# Patient Record
Sex: Female | Born: 1990 | Race: White | Hispanic: No | Marital: Single | Smoking: Current every day smoker
Health system: Southern US, Community
[De-identification: ages and names within clinical notes are randomized; demographics above are authoritative.]

## PROBLEM LIST (undated history)

## (undated) DIAGNOSIS — E282 Polycystic ovarian syndrome: Secondary | ICD-10-CM

## (undated) HISTORY — PX: KNEE ARTHROSCOPY W/ ACL RECONSTRUCTION: SHX1858

## (undated) HISTORY — PX: APPENDECTOMY: SHX54

---

## 2020-12-02 ENCOUNTER — Encounter (HOSPITAL_COMMUNITY): Payer: Self-pay

## 2020-12-02 ENCOUNTER — Emergency Department (HOSPITAL_COMMUNITY)
Admission: EM | Admit: 2020-12-02 | Discharge: 2020-12-02 | Disposition: A | Discharge status: Home / Self Care | Payer: Medicaid Other | Attending: Emergency Medicine | Admitting: Emergency Medicine

## 2020-12-02 ENCOUNTER — Emergency Department (HOSPITAL_COMMUNITY): Payer: Medicaid Other

## 2020-12-02 DIAGNOSIS — O99331 Smoking (tobacco) complicating pregnancy, first trimester: Secondary | ICD-10-CM | POA: Diagnosis not present

## 2020-12-02 DIAGNOSIS — R102 Pelvic and perineal pain: Secondary | ICD-10-CM | POA: Diagnosis not present

## 2020-12-02 DIAGNOSIS — F17210 Nicotine dependence, cigarettes, uncomplicated: Secondary | ICD-10-CM | POA: Insufficient documentation

## 2020-12-02 DIAGNOSIS — O26891 Other specified pregnancy related conditions, first trimester: Secondary | ICD-10-CM | POA: Insufficient documentation

## 2020-12-02 DIAGNOSIS — Z3A01 Less than 8 weeks gestation of pregnancy: Secondary | ICD-10-CM | POA: Insufficient documentation

## 2020-12-02 HISTORY — DX: Polycystic ovarian syndrome: E28.2

## 2020-12-02 LAB — URINALYSIS, ROUTINE W REFLEX MICROSCOPIC
Bilirubin Urine: NEGATIVE
Glucose, UA: NEGATIVE mg/dL
Hgb urine dipstick: NEGATIVE
Ketones, ur: NEGATIVE mg/dL
Leukocytes,Ua: NEGATIVE
Nitrite: NEGATIVE
Protein, ur: NEGATIVE mg/dL
Specific Gravity, Urine: 1.014 (ref 1.005–1.030)
pH: 6 (ref 5.0–8.0)

## 2020-12-02 LAB — HCG, QUANTITATIVE, PREGNANCY: hCG, Beta Chain, Quant, S: 3061 m[IU]/mL — ABNORMAL HIGH (ref <5)

## 2020-12-02 MED ORDER — FENTANYL CITRATE (PF) 100 MCG/2ML IJ SOLN
50.0000 ug | Freq: Once | INTRAMUSCULAR | Status: AC
Start: 2020-12-02 — End: 2020-12-02
  Administered 2020-12-02: 50 ug via INTRAMUSCULAR
  Filled 2020-12-02: qty 2

## 2020-12-02 MED ORDER — FENTANYL CITRATE (PF) 100 MCG/2ML IJ SOLN: 50.0000 ug | Freq: Once | INTRAMUSCULAR | Status: DC

## 2020-12-02 NOTE — Discharge Instructions (Addendum)
Your workup today was overall reassuring. Please make sure to follow-up with your OB/GYN.  Return to the ER for any new or worsening symptoms.

## 2020-12-02 NOTE — ED Notes (Signed)
Pt discharged from this ED in stable condition at this time. All discharge instructions and follow up care reviewed with pt with no further questions at this time. Pt ambulatory with steady gait, clear speech.  

## 2020-12-02 NOTE — ED Provider Notes (Signed)
[facility]-EMERGENCY DEPT Provider Note   CSN: [id number] Arrival date & time: 12/02/20  1108     History Chief Complaint  Patient presents with  . Pelvic Pain    Patricia Rowland is a 30 y.o. female.  HPI 30 year old female with a history of PCOS presents to the ER with left lower pelvic pain.  Patient states that she is approximately [redacted] weeks pregnant, had a ultrasound approximately a week ago which did not confirm an intrauterine pregnancy.  She came here to the ER yesterday, had labs done, urine, hCG but left without being seen.  She states that she left because she started to feel better.  She then started to develop some sharper left lower pelvic pain this morning which brought her back to the ER.  She denies any nausea or vomiting.  No vaginal bleeding or discharge.  She states that she is scheduled for an abortion next week.  She does have an OB/GYN.    Past Medical History:  Diagnosis Date  . PCOS (polycystic ovarian syndrome)     There are no problems to display for this patient.   Past Surgical History:  Procedure Laterality Date  . APPENDECTOMY    . KNEE ARTHROSCOPY W/ ACL RECONSTRUCTION       OB History    Gravida  1   Para      Term      Preterm      AB      Living        SAB      IAB      Ectopic      Multiple      Live Births              History reviewed. No pertinent family history.  Social History   Tobacco Use  . Smoking status: Current Every Day Smoker    Packs/day: 0.50    Types: Cigarettes  . Smokeless tobacco: Never Used    Home Medications Prior to Admission medications   Medication Sig Start Date End Date Taking? Authorizing Provider  acetaminophen (TYLENOL) 500 MG tablet Take 1,000 mg by mouth every 6 (six) hours as needed for mild pain, headache or fever.   Yes [provider]  ibuprofen (ADVIL) 200 MG tablet Take 800 mg by mouth every 6 (six) hours as needed for fever, mild pain  or headache.   Yes [provider]  cephALEXin (KEFLEX) 500 MG capsule Take 500 mg by mouth 2 (two) times daily. 11/22/20   [provider]    Allergies    Patient has no known allergies.  Review of Systems   Review of Systems  Constitutional: Negative for chills and fever.  HENT: Negative for ear pain and sore throat.   Eyes: Negative for pain and visual disturbance.  Respiratory: Negative for cough and shortness of breath.   Cardiovascular: Negative for chest pain and palpitations.  Gastrointestinal: Negative for abdominal pain and vomiting.  Genitourinary: Positive for pelvic pain. Negative for dysuria and hematuria.  Musculoskeletal: Negative for arthralgias and back pain.  Skin: Negative for color change and rash.  Neurological: Negative for seizures and syncope.  All other systems reviewed and are negative.   Physical Exam Updated Vital Signs BP (!) 124/108   Pulse 76   Temp 98 F (36.7 C) (Oral)   Resp 16   SpO2 98%   Physical Exam Vitals and nursing note reviewed.  Constitutional:  General: She is not in acute distress.    Appearance: She is well-developed.  HENT:     Head: Normocephalic and atraumatic.  Eyes:     Conjunctiva/sclera: Conjunctivae normal.  Cardiovascular:     Rate and Rhythm: Normal rate and regular rhythm.     Heart sounds: No murmur heard.   Pulmonary:     Effort: Pulmonary effort is normal. No respiratory distress.     Breath sounds: Normal breath sounds.  Abdominal:     Palpations: Abdomen is soft.     Tenderness: There is no abdominal tenderness.  Genitourinary:    Cervix: Normal.     Uterus: Normal.      Adnexa:        Right: No tenderness.         Left: Tenderness present.   Musculoskeletal:     Cervical back: Neck supple.  Skin:    General: Skin is warm and dry.  Neurological:     Mental Status: She is alert.     ED Results / Procedures / Treatments   Labs (all labs ordered are listed, but only  abnormal results are displayed) Labs Reviewed  URINALYSIS, ROUTINE W REFLEX MICROSCOPIC  HCG, QUANTITATIVE, PREGNANCY    EKG None  Radiology US OB Comp < 14 Wks  Result Date: 12/02/2020 CLINICAL DATA:  Pelvic pain. Estimated gestational age per LMP 7 weeks 0 days. Quantitative beta HCG from 12/01/2020 is 3114. No quantitative HCG today. EXAM: OBSTETRIC <14 WK [place] AND TRANSVAGINAL OB US TECHNIQUE: Both transabdominal and transvaginal ultrasound examinations were performed for complete evaluation of the gestation as well as the maternal uterus, adnexal regions, and pelvic cul-de-sac. Transvaginal technique was performed to assess early pregnancy. COMPARISON:  11/21/2020 from [facility] medical center. FINDINGS: Intrauterine gestational sac: None visualized over the endometrium. Yolk sac:  Not visualized. Embryo: Not visualized. Cardiac Activity: Not visualized. Heart Rate: Not visualized bpm MSD: Not visualized. CRL: Not visualized. Subchorionic hemorrhage:  None visualized. Maternal uterus/adnexae: There is an oval hypoechoic focus over the anterior uterine fundus as seen on the previous ultrasound measuring approximately 9 mm in diameter (previously 1.3 cm). No definitive fetal pole or yolk sac within this small hypoechoic focus as this was thought to previously represent intramural fibroid. There is a central area of increased echogenicity within this small oval hypoechoic focus. It is much less likely that this represents an intrauterine gestational sac. Ovaries are normal size, shape and position without evidence of focal mass. Normal color Doppler is present over the left ovary with peripheral color Doppler over the right ovary. Multiple peripheral follicles within each ovary. No additional masses or abnormality within each adnexa. No significant free pelvic fluid. IMPRESSION: Normal endometrium. Indeterminate oval 9 mm hypoechoic area over the anterior uterine fundus slightly smaller than seen on the  recent previous ultrasound and most typical of a fibroid. No definitive gestational sac/IUP visualized. No abnormal adnexal findings. No intrauterine gestational sac, yolk sac, fetal pole, or cardiac activity visualized. Differential considerations include intrauterine gestation too early to be sonographically visualized, spontaneous abortion, or ectopic pregnancy. Consider follow-up ultrasound in 14 days and serial quantitative beta HCG follow-up. Electronically Signed   By: Elberta Fortis M.D.   On: 12/02/2020 13:57    Procedures Procedures   Medications Ordered in ED Medications  fentaNYL (SUBLIMAZE) injection 50 mcg (50 mcg Intramuscular Given 12/02/20 1250)    ED Course  I have reviewed the triage vital signs and the nursing notes.  Pertinent labs &  imaging results that were available during my care of the patient were reviewed by me and considered in my medical decision making (see chart for details).    MDM Rules/Calculators/A&P                          30 year old female is currently [redacted] weeks pregnant with left lower pelvic pain.  Vitals on arrival reassuring.  She had some left-sided adnexal tenderness on exam.  No visible blood products in the vaginal vault.  Reviewed labs from yesterday, CBC and BMP largely unremarkable.  UA without evidence of UTI.  Ultrasound without confirmation of intrauterine pregnancy, a recommend repeat ultrasound in 14 days.  Will repeat quant, her quant from yesterday was 3114.  On reevaluation, patient is significant improvement in her pain after pain medicine.  Patient states that she is following up with a clinic next week.  Low suspicion for diverticulitis, no evidence of ruptured ectopic at this time.  We discussed return precautions.  She voiced understanding and is agreeable.  Case discussed with Dr. Pauline Aus who is agreeable to the above plan and disposition. Final Clinical Impression(s) / ED Diagnoses Final diagnoses:  Pelvic pain in female    Rx /  DC Orders ED Discharge Orders    None       Leone Brand 12/02/20 1441    Tilden Fossa, MD 12/02/20 1527

## 2020-12-02 NOTE — ED Triage Notes (Signed)
Pt arrived via walk in, c/o pelvic pain and back pain. States she is approx [redacted] wks pregnant, last menes feb 25th. Denies any active bleeding.

## 2022-02-27 IMAGING — US US OB COMP LESS 14 WK
1 series · 13 of 28 positions shown · non-contrast
Comparison: 11/21/2020 from [facility] [HOSPITAL[facility]

CLINICAL DATA: Pelvic pain. Estimated gestational age per LMP 7
quantitative HCG today.

EXAM:
OBSTETRIC <14 WK US AND TRANSVAGINAL OB US
TECHNIQUE: Both transabdominal and transvaginal ultrasound examinations were
performed for complete evaluation of the gestation as well as the
maternal uterus, adnexal regions, and pelvic cul-de-sac.
Transvaginal technique was performed to assess early pregnancy.

[Series 1: us ob comp less 14 wk · 13 of 104 slices shown]
[im 4/104]
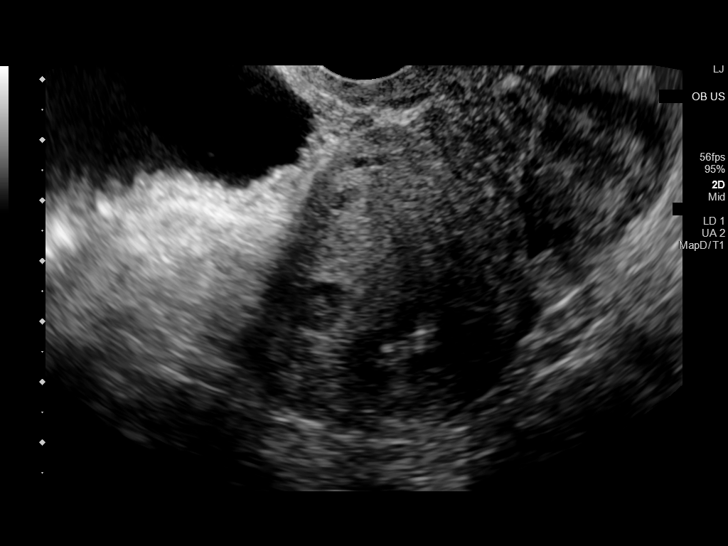
[im 12/104]
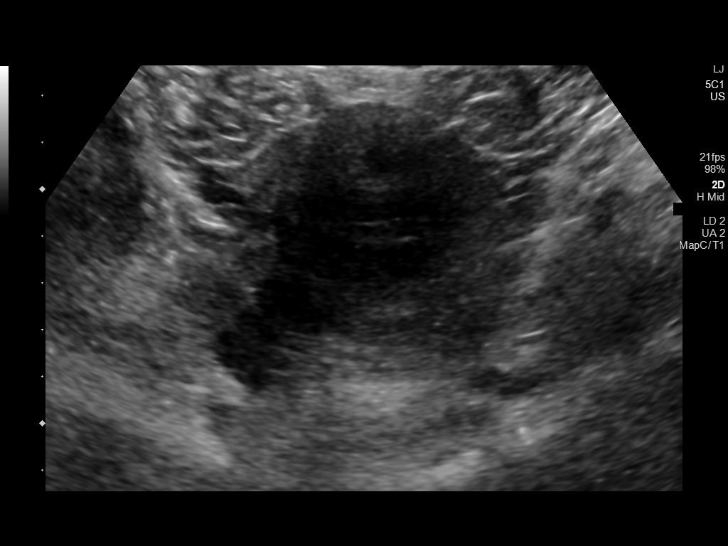
[im 20/104]
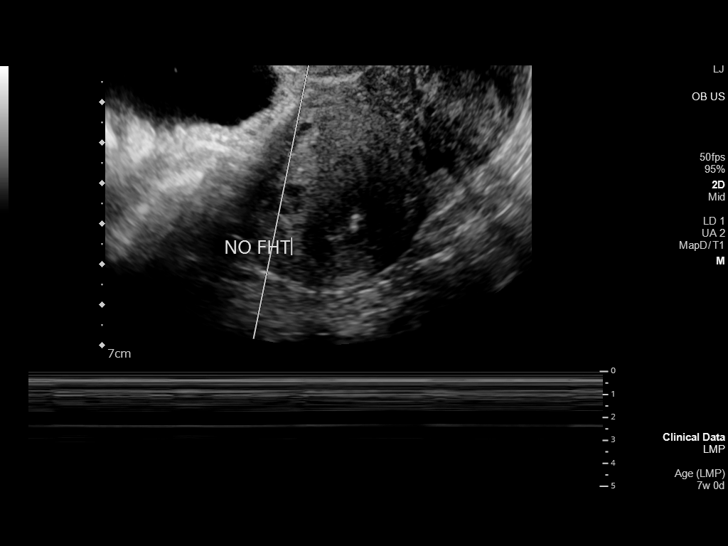
[im 27/104]
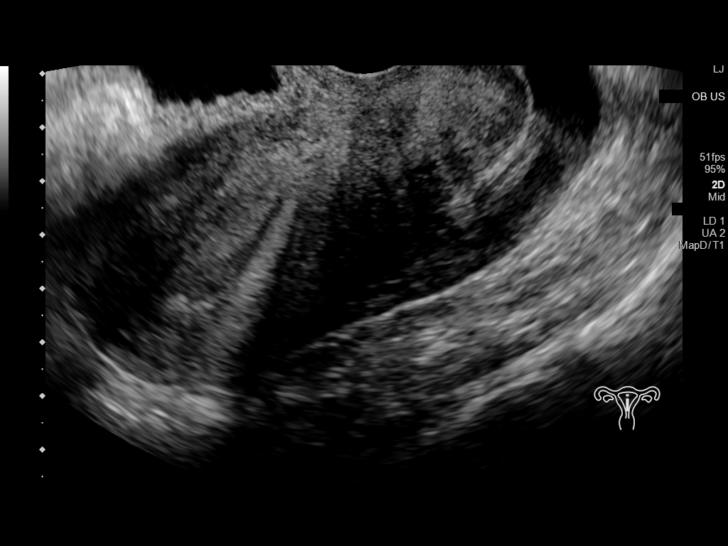
[im 35/104]
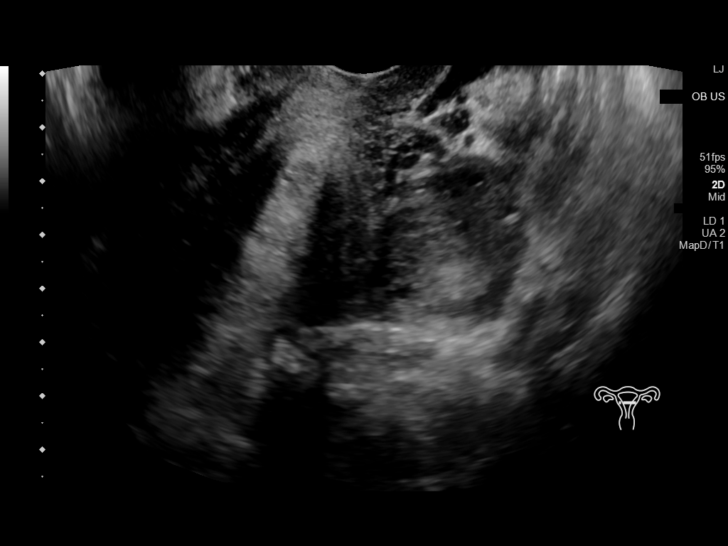
[im 42/104]
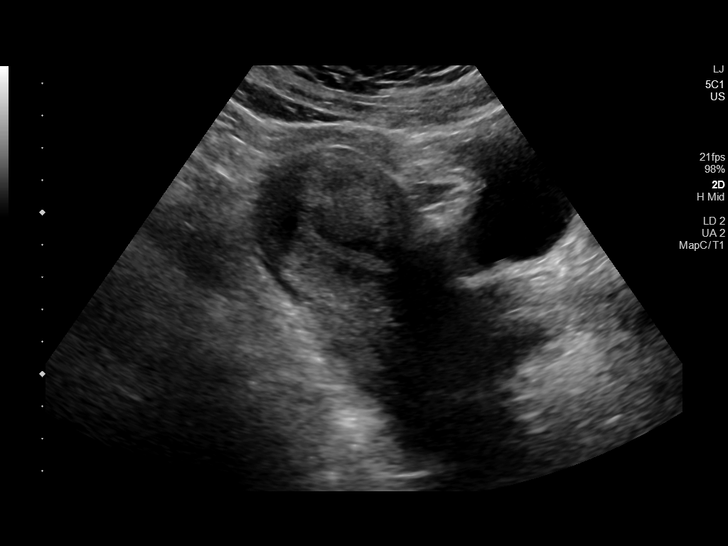
[im 54/104]
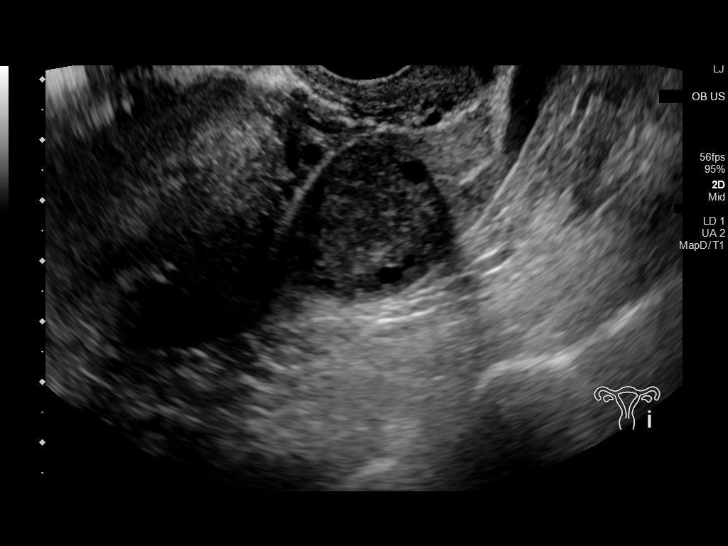
[im 62/104]
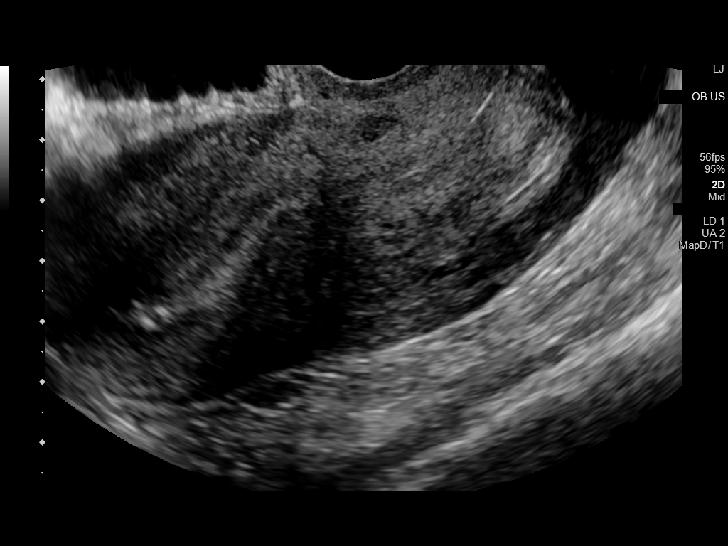
[im 69/104]
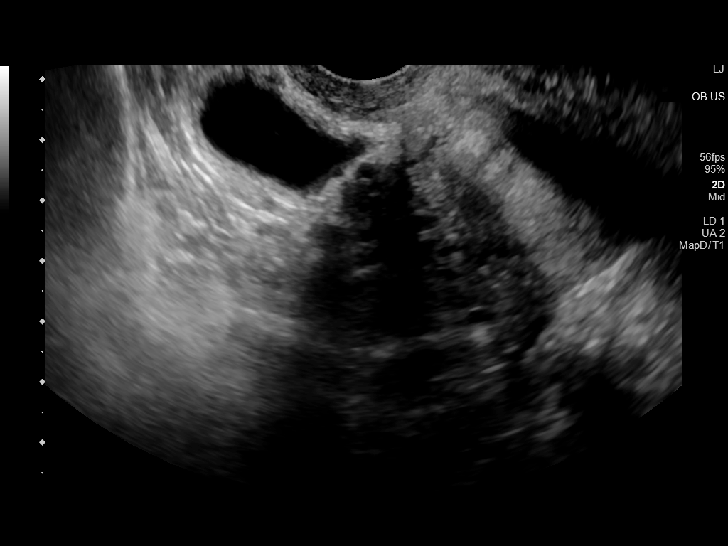
[im 77/104]
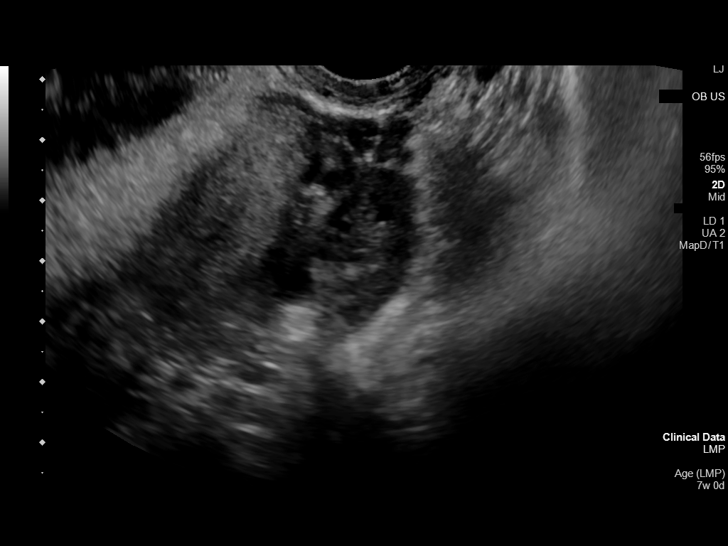
[im 84/104]
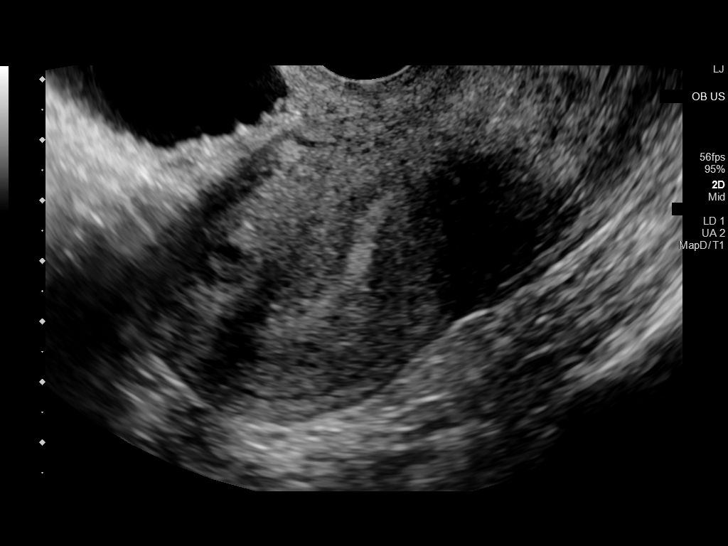
[im 92/104]
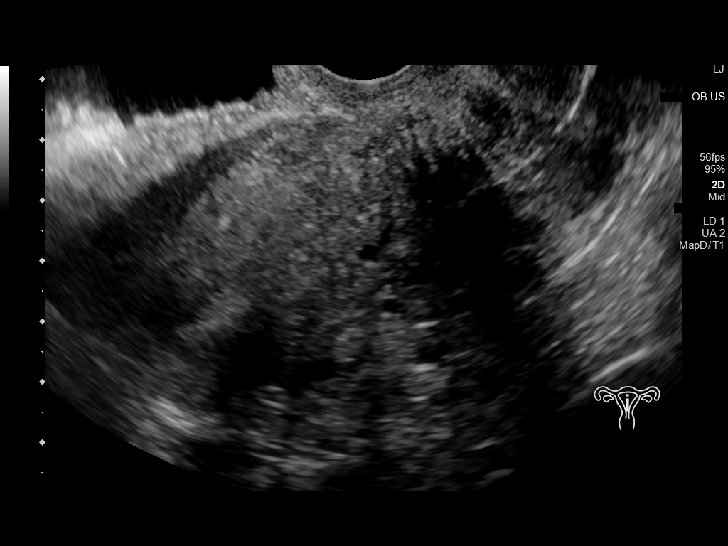
[im 100/104]
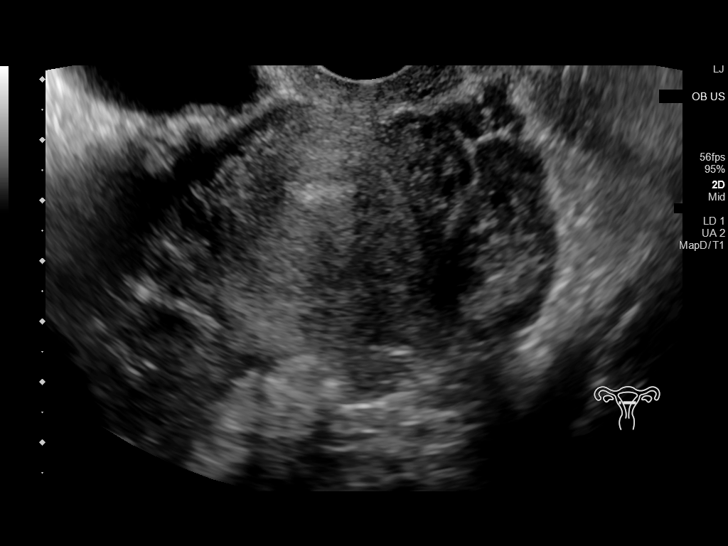

[13 of 28 positions shown; findings below may reference images not displayed]

FINDINGS: Intrauterine gestational sac: None visualized over the endometrium.

Yolk sac:  Not visualized.

Embryo: Not visualized.

Cardiac Activity: Not visualized.

Heart Rate: Not visualized bpm

MSD: Not visualized.

CRL: Not visualized.

Subchorionic hemorrhage:  None visualized.

Maternal uterus/adnexae: There is an oval hypoechoic focus over the
anterior uterine fundus as seen on the previous ultrasound measuring
approximately 9 mm in diameter (previously 1.3 cm). No definitive
fetal pole or yolk sac within this small hypoechoic focus as this
was thought to previously represent intramural fibroid. There is a
central area of increased echogenicity within this small oval
hypoechoic focus. It is much less likely that this represents an
intrauterine gestational sac.

Ovaries are normal size, shape and position without evidence of
focal mass. Normal color Doppler is present over the left ovary with
peripheral color Doppler over the right ovary. Multiple peripheral
follicles within each ovary. No additional masses or abnormality
within each adnexa. No significant free pelvic fluid.
IMPRESSION: Normal endometrium. Indeterminate oval 9 mm hypoechoic area over the
anterior uterine fundus slightly smaller than seen on the recent
previous ultrasound and most typical of a fibroid. No definitive
gestational sac/IUP visualized. No abnormal adnexal findings. No
intrauterine gestational sac, yolk sac, fetal pole, or cardiac
activity visualized. Differential considerations include
intrauterine gestation too early to be sonographically visualized,
spontaneous abortion, or ectopic pregnancy. Consider follow-up
ultrasound in 14 days and serial quantitative beta HCG follow-up.
